# Patient Record
Sex: Female | Born: 1970 | Race: White | Hispanic: No | Marital: Married | Smoking: Never smoker
Health system: Southern US, Community
[De-identification: ages and names within clinical notes are randomized; demographics above are authoritative.]

---

## 2011-02-14 ENCOUNTER — Ambulatory Visit: Payer: Self-pay | Admitting: Obstetrics and Gynecology

## 2012-05-25 ENCOUNTER — Ambulatory Visit: Payer: Self-pay | Admitting: Family Medicine

## 2014-08-22 ENCOUNTER — Ambulatory Visit: Payer: Self-pay | Admitting: Family Medicine

## 2014-08-23 ENCOUNTER — Ambulatory Visit: Payer: Self-pay | Admitting: Family Medicine

## 2015-05-09 ENCOUNTER — Other Ambulatory Visit: Payer: Self-pay | Admitting: Family Medicine

## 2015-05-09 DIAGNOSIS — Z1231 Encounter for screening mammogram for malignant neoplasm of breast: Secondary | ICD-10-CM

## 2015-08-24 ENCOUNTER — Ambulatory Visit
Admission: RE | Admit: 2015-08-24 | Discharge: 2015-08-24 | Disposition: A | Discharge status: Home / Self Care | Payer: BLUE CROSS/BLUE SHIELD | Source: Ambulatory Visit | Attending: Family Medicine | Admitting: Family Medicine

## 2015-08-24 DIAGNOSIS — Z1231 Encounter for screening mammogram for malignant neoplasm of breast: Secondary | ICD-10-CM | POA: Insufficient documentation

## 2015-09-24 ENCOUNTER — Emergency Department: Payer: BLUE CROSS/BLUE SHIELD

## 2015-09-24 ENCOUNTER — Emergency Department
Admission: EM | Admit: 2015-09-24 | Discharge: 2015-09-24 | Disposition: A | Discharge status: Home / Self Care | Payer: BLUE CROSS/BLUE SHIELD | Attending: Emergency Medicine | Admitting: Emergency Medicine

## 2015-09-24 DIAGNOSIS — M546 Pain in thoracic spine: Secondary | ICD-10-CM

## 2015-09-24 DIAGNOSIS — G8929 Other chronic pain: Secondary | ICD-10-CM | POA: Insufficient documentation

## 2015-09-24 DIAGNOSIS — R091 Pleurisy: Secondary | ICD-10-CM | POA: Diagnosis not present

## 2015-09-24 LAB — BASIC METABOLIC PANEL
Anion gap: 5 (ref 5–15)
BUN: 12 mg/dL (ref 6–20)
CALCIUM: 8.9 mg/dL (ref 8.9–10.3)
CO2: 27 mmol/L (ref 22–32)
CREATININE: 0.64 mg/dL (ref 0.44–1.00)
Chloride: 107 mmol/L (ref 101–111)
Glucose, Bld: 85 mg/dL (ref 65–99)
Potassium: 3.8 mmol/L (ref 3.5–5.1)
Sodium: 139 mmol/L (ref 135–145)

## 2015-09-24 LAB — FIBRIN DERIVATIVES D-DIMER (ARMC ONLY): Fibrin derivatives D-dimer (ARMC): 236 (ref 0–499)

## 2015-09-24 MED ORDER — DIAZEPAM 5 MG PO TABS: 5.0000 mg | ORAL_TABLET | Freq: Three times a day (TID) | ORAL | Status: AC | PRN

## 2015-09-24 MED ORDER — KETOROLAC TROMETHAMINE 30 MG/ML IJ SOLN
30.0000 mg | Freq: Once | INTRAMUSCULAR | Status: AC
  Administered 2015-09-24: 30 mg via INTRAMUSCULAR
  Filled 2015-09-24: qty 1

## 2015-09-24 MED ORDER — NAPROXEN 500 MG PO TABS: 500.0000 mg | ORAL_TABLET | Freq: Two times a day (BID) | ORAL | Status: DC

## 2015-09-24 NOTE — Discharge Instructions (Signed)

## 2015-09-24 NOTE — ED Provider Notes (Signed)
[facility] Emergency Department Provider Note  ____________________________________________  Time seen: On arrival  I have reviewed the triage vital signs and the nursing notes.   HISTORY  Chief Complaint Back Pain    HPI Laura Cummings is a 44 y.o. female who presents with mild to moderate left upper back pain. She reports it started 2 days ago and became worse this morning to the point where if she takes a deep breath it hurts. She reports she has chronic lower back pain but this pain is no different location. She denies injury to the area. No cough no fevers. No shortness of breath. No tachycardia. No calf pain. No recent travel. No history of DVTs. No hormone use. No chest pain     History reviewed. No pertinent past medical history.  There are no active problems to display for this patient.   History reviewed. No pertinent past surgical history.  No current outpatient prescriptions on file.  Allergies Review of patient's allergies indicates no known allergies.  History reviewed. No pertinent family history.  Social History Social History  Substance Use Topics  . Smoking status: Never Smoker   . Smokeless tobacco: None  . Alcohol Use: No    Review of Systems  Constitutional: Negative for fever. Eyes: Negative for visual changes. ENT: Negative for sore throat Cardiovascular: Negative for chest pain. Respiratory: Negative for shortness of breath. Positive for pleurisy Gastrointestinal: Negative for abdominal pain, vomiting and diarrhea. Genitourinary: Negative for dysuria. Musculoskeletal: As above for back pain. Skin: Negative for rash. Neurological: Negative for headaches oPsychiatric: No anxiety    ____________________________________________   PHYSICAL EXAM:  VITAL SIGNS: ED Triage Vitals  Enc Vitals Group     BP 09/24/15 0740 110/63 mmHg     Pulse Rate 09/24/15 0740 84     Resp 09/24/15 0740 16     Temp  09/24/15 0740 98 F (36.7 C)     Temp Source 09/24/15 0740 Oral     SpO2 09/24/15 0740 100 %     Weight 09/24/15 0740 180 lb (81.647 kg)     Height 09/24/15 0740  (1.626 m)     Head Cir --      Peak Flow --      Pain Score 09/24/15 0738 8     Pain Loc --      Pain Edu? --      Excl. in GC? --      Constitutional: Alert and oriented. Well appearing and in no distress. Eyes: Conjunctivae are normal.  ENT   Head: Normocephalic and atraumatic.   Mouth/Throat: Mucous membranes are moist. Cardiovascular: Normal rate, regular rhythm. Normal and symmetric distal pulses are present in all extremities. No murmurs, rubs, or gallops. Respiratory: Normal respiratory effort without tachypnea nor retractions. Breath sounds are clear and equal bilaterally.  Gastrointestinal: Soft and non-tender in all quadrants. No distention. There is no CVA tenderness. Genitourinary: deferred Musculoskeletal: Nontender with normal range of motion in all extremities. No lower extremity tenderness nor edema. Patient with mild discomfort with palpation just inferior to the left scapula, she has pain in this area if she moves primarily with flexion or rotation of the torso. No rash or bony abnormalities. Neurologic:  Normal speech and language. No gross focal neurologic deficits are appreciated. Skin:  Skin is warm, dry and intact. No rash noted. Psychiatric: Mood and affect are normal. Patient exhibits appropriate insight and judgment.  ____________________________________________    LABS (pertinent positives/negatives)  Labs  Reviewed - No data to display  ____________________________________________   EKG  None  ____________________________________________    RADIOLOGY I have personally reviewed any xrays that were ordered on this patient: Chest x-ray unremarkable  ____________________________________________   PROCEDURES  Procedure(s) performed: none  Critical Care performed:  none  ____________________________________________   INITIAL IMPRESSION / ASSESSMENT AND PLAN / ED COURSE  Pertinent labs & imaging results that were available during my care of the patient were reviewed by me and considered in my medical decision making (see chart for details).  Talbert ForestShirley with patient's history of present illness this seems muscular skeletal in nature but given her pleurisy I recommended d-dimer/labs with analgesics. The patient feels strongly this is similar in nature and would prefer not to do labs. Given that she is PERC negative I think this is reasonable especially because she is stated that if she becomes worse she will return to the emergency department. Hence the plan is IM Toradol and chest x-ray and reevaluation  Patient changed her mind and did request lab work which we will do.  D-dimer unremarkable. Patient is appropriate for discharge with outpatient follow-up  ____________________________________________   FINAL CLINICAL IMPRESSION(S) / ED DIAGNOSES  Final diagnoses:  Left-sided thoracic back pain     Jene Everyobert Delsin Copen, MD 09/24/15 1455

## 2015-09-24 NOTE — ED Notes (Signed)
Patient comes in with complaints of mid back pain that radiates to left flank and hurts with inspiration.  Patient reports that pain started Saturday. Last dose of ibuprofen 600mg  at 06:15 today with no pain relief.

## 2015-09-24 NOTE — ED Notes (Signed)
Pt discharged home after verbalizing understanding of discharge instructions; nad noted. 

## 2015-09-24 NOTE — ED Notes (Signed)
Pt from home with back pain. States she has had lower back pain for years in the early morning. She states that pain is constant, between shoulder blades, and she has intermittent sharp pains that keep her from getting comfortable. Pt is alert & oriented, and restless & fidgety.

## 2015-12-12 ENCOUNTER — Ambulatory Visit: Payer: Self-pay | Admitting: Physician Assistant

## 2015-12-12 ENCOUNTER — Encounter: Payer: Self-pay | Admitting: Physician Assistant

## 2015-12-12 VITALS — BP 120/70 | HR 80 | Temp 97.8°F

## 2015-12-12 DIAGNOSIS — J029 Acute pharyngitis, unspecified: Secondary | ICD-10-CM

## 2015-12-12 DIAGNOSIS — R6883 Chills (without fever): Secondary | ICD-10-CM

## 2015-12-12 LAB — POCT URINALYSIS DIPSTICK
Bilirubin, UA: NEGATIVE
GLUCOSE UA: NEGATIVE
Ketones, UA: NEGATIVE
Leukocytes, UA: NEGATIVE
Nitrite, UA: NEGATIVE
PH UA: 6
Protein, UA: NEGATIVE
UROBILINOGEN UA: 0.2

## 2015-12-12 LAB — POCT INFLUENZA A/B
INFLUENZA A, POC: NEGATIVE
INFLUENZA B, POC: NEGATIVE

## 2015-12-12 MED ORDER — AZITHROMYCIN 250 MG PO TABS
ORAL_TABLET | ORAL | Status: DC
Start: 2015-12-12 — End: 2016-11-28

## 2015-12-12 NOTE — Progress Notes (Signed)
S: having chills, scratchy throat, no cough or congestion , some ear pain last night, no uti sx, no v/d  O: vitals wnl, nad, appears well, tms clear, nasal mucosa wnl, throat mildly injected; neck supple no lymph, lungs c t a, cv rrr, flu swab neg, ua neg  A: post nasal drip, pharyngitis  P: zpack

## 2016-08-01 ENCOUNTER — Other Ambulatory Visit: Payer: Self-pay | Admitting: Family Medicine

## 2016-08-01 DIAGNOSIS — Z1231 Encounter for screening mammogram for malignant neoplasm of breast: Secondary | ICD-10-CM

## 2016-09-02 ENCOUNTER — Ambulatory Visit
Admission: RE | Admit: 2016-09-02 | Discharge: 2016-09-02 | Disposition: A | Discharge status: Home / Self Care | Payer: BLUE CROSS/BLUE SHIELD | Source: Ambulatory Visit | Attending: Family Medicine | Admitting: Family Medicine

## 2016-09-02 DIAGNOSIS — Z1231 Encounter for screening mammogram for malignant neoplasm of breast: Secondary | ICD-10-CM | POA: Diagnosis not present

## 2016-11-13 ENCOUNTER — Encounter: Payer: Self-pay | Admitting: Physician Assistant

## 2016-11-13 ENCOUNTER — Ambulatory Visit: Payer: Self-pay | Admitting: Physician Assistant

## 2016-11-13 VITALS — BP 98/70 | HR 86 | Temp 97.8°F

## 2016-11-13 DIAGNOSIS — J0120 Acute ethmoidal sinusitis, unspecified: Secondary | ICD-10-CM

## 2016-11-13 MED ORDER — AMOXICILLIN 500 MG PO CAPS: 500.0000 mg | ORAL_CAPSULE | Freq: Three times a day (TID) | ORAL | 0 refills | Status: DC

## 2016-11-13 MED ORDER — FEXOFENADINE-PSEUDOEPHED ER 60-120 MG PO TB12: 1.0000 | ORAL_TABLET | Freq: Two times a day (BID) | ORAL | 0 refills | Status: AC

## 2016-11-13 NOTE — Progress Notes (Signed)
   Subjective:sinus and ear pain    Patient ID: Laura Cummings, female    DOB: 1971/10/14, 46 y.o.   MRN: [id number]  HPI Patient c/o sinus pressure, ear pain, and sore throat for one week. Denies fever/chill, or N/V/D. States this is a regular seasonal onset. No palliative measures for compliant.   Review of Systems Negative except for compliant.    Objective:   Physical Exam HEENT for bilateral maxillary guarding, edematous nasal turbinates with thick rhinorrhea. Copious post nasal drainage. Bilateral edematous TM. Neck supple, Lungs CTA, and Heart RRR.       Assessment & Plan:Sinusitis  Amoxil and Allergra-D as directed. Follow up one week if no improvement.

## 2016-11-28 ENCOUNTER — Ambulatory Visit: Payer: Self-pay | Admitting: Physician Assistant

## 2016-11-28 ENCOUNTER — Encounter: Payer: Self-pay | Admitting: Physician Assistant

## 2016-11-28 VITALS — BP 100/70 | HR 92 | Temp 98.6°F

## 2016-11-28 DIAGNOSIS — J209 Acute bronchitis, unspecified: Secondary | ICD-10-CM

## 2016-11-28 MED ORDER — AZITHROMYCIN 250 MG PO TABS: ORAL_TABLET | ORAL | 0 refills | Status: DC

## 2016-11-28 MED ORDER — BENZONATATE 200 MG PO CAPS: 200.0000 mg | ORAL_CAPSULE | Freq: Three times a day (TID) | ORAL | 0 refills | Status: DC | PRN

## 2016-11-28 NOTE — Progress Notes (Signed)
S: C/o runny nose and congestion for 2 weeks, cough for 7 days, no fever, chills, cp/sob, v/d; mucus was green this am but clear throughout the day, cough is sporadic, was on amoxil over 2 weeks ago but quit taking the medicine when she felt better and let her husband take the rest  Using otc meds:   O: PE: vitals wnl, nad, perrl eomi, normocephalic, tms dull, nasal mucosa red and swollen, throat injected, neck supple no lymph, lungs c t a, cv rrr, neuro intact  A:  Acute bronchitis   P: drink fluids, continue regular meds , use otc meds of choice, return if not improving in 5 days, return earlier if worsening , zpack, tessalon perls

## 2016-12-01 ENCOUNTER — Ambulatory Visit: Payer: Self-pay | Admitting: Physician Assistant

## 2016-12-01 ENCOUNTER — Encounter: Payer: Self-pay | Admitting: Physician Assistant

## 2016-12-01 VITALS — BP 110/62 | HR 106 | Temp 99.6°F

## 2016-12-01 DIAGNOSIS — A084 Viral intestinal infection, unspecified: Secondary | ICD-10-CM

## 2016-12-01 MED ORDER — PROMETHAZINE HCL 25 MG PO TABS: 25.0000 mg | ORAL_TABLET | Freq: Three times a day (TID) | ORAL | 0 refills | Status: DC | PRN

## 2016-12-01 NOTE — Patient Instructions (Addendum)
Viral Illness, Adult Viruses are tiny germs that can get into a person's body and cause illness. There are many different types of viruses, and they cause many types of illness. Viral illnesses can range from mild to severe. They can affect various parts of the body. Common illnesses that are caused by a virus include colds and the flu. Viral illnesses also include serious conditions such as HIV/AIDS (human immunodeficiency virus/acquired immunodeficiency syndrome). A few viruses have been linked to certain cancers. What are the causes? Many types of viruses can cause illness. Viruses invade cells in your body, multiply, and cause the infected cells to malfunction or die. When the cell dies, it releases more of the virus. When this happens, you develop symptoms of the illness, and the virus continues to spread to other cells. If the virus takes over the function of the cell, it can cause the cell to divide and grow out of control, as is the case when a virus causes cancer. Different viruses get into the body in different ways. You can get a virus by:  Swallowing food or water that is contaminated with the virus.  Breathing in droplets that have been coughed or sneezed into the air by an infected person.  Touching a surface that has been contaminated with the virus and then touching your eyes, nose, or mouth.  Being bitten by an insect or animal that carries the virus.  Having sexual contact with a person who is infected with the virus.  Being exposed to blood or fluids that contain the virus, either through an open cut or during a transfusion. If a virus enters your body, your body's defense system (immune system) will try to fight the virus. You may be at higher risk for a viral illness if your immune system is weak. What are the signs or symptoms? Symptoms vary depending on the type of virus and the location of the cells that it invades. Common symptoms of the main types of viral illnesses  include: Cold and flu viruses   Fever.  Headache.  Sore throat.  Muscle aches.  Nasal congestion.  Cough. Digestive system (gastrointestinal) viruses   Fever.  Abdominal pain.  Nausea.  Diarrhea. Liver viruses (hepatitis)   Loss of appetite.  Tiredness.  Yellowing of the skin (jaundice). Brain and spinal cord viruses   Fever.  Headache.  Stiff neck.  Nausea and vomiting.  Confusion or sleepiness. Skin viruses   Warts.  Itching.  Rash. Sexually transmitted viruses   Discharge.  Swelling.  Redness.  Rash. How is this treated? Viruses can be difficult to treat because they live within cells. Antibiotic medicines do not treat viruses because these drugs do not get inside cells. Treatment for a viral illness may include:  Resting and drinking plenty of fluids.  Medicines to relieve symptoms. These can include over-the-counter medicine for pain and fever, medicines for cough or congestion, and medicines to relieve diarrhea.  Antiviral medicines. These drugs are available only for certain types of viruses. They may help reduce flu symptoms if taken early. There are also many antiviral medicines for hepatitis and HIV/AIDS. Some viral illnesses can be prevented with vaccinations. A common example is the flu shot. Follow these instructions at home: Medicines    Take over-the-counter and prescription medicines only as told by your health care provider.  If you were prescribed an antiviral medicine, take it as told by your health care provider. Do not stop taking the medicine even if you start to   are needed and when they are not needed. Antibiotics do not treat viruses. If your health care provider thinks that you may have a bacterial infection as well as a viral infection, you may get an antibiotic.  Do not ask for an antibiotic prescription if you have been diagnosed with a viral illness. That will not make your  illness go away faster.  Frequently taking antibiotics when they are not needed can lead to antibiotic resistance. When this develops, the medicine no longer works against the bacteria that it normally fights. General instructions  Drink enough fluids to keep your urine clear or pale yellow.  Rest as much as possible.  Return to your normal activities as told by your health care provider. Ask your health care provider what activities are safe for you.  Keep all follow-up visits as told by your health care provider. This is important. How is this prevented? Take these actions to reduce your risk of viral infection:  Eat a healthy diet and get enough rest.  Wash your hands often with soap and water. This is especially important when you are in public places. If soap and water are not available, use hand sanitizer.  Avoid close contact with friends and family who have a viral illness.  If you travel to areas where viral gastrointestinal infection is common, avoid drinking water or eating raw food.  Keep your immunizations up to date. Get a flu shot every year as told by your health care provider.  Do not share toothbrushes, nail clippers, razors, or needles with other people.  Always practice safe sex. Contact a health care provider if:  You have symptoms of a viral illness that do not go away.  Your symptoms come back after going away.  Your symptoms get worse. Get help right away if:  You have trouble breathing.  You have a severe headache or a stiff neck.  You have severe vomiting or abdominal pain. This information is not intended to replace advice given to you by your health care provider. Make sure you discuss any questions you have with your health care provider. Document Released: 02/15/2016 Document Revised: 03/19/2016 Document Reviewed: 02/15/2016 Elsevier Interactive Patient Education  2017 [employer]  Viral Gastroenteritis, Adult Introduction Viral  gastroenteritis is also known as the stomach flu. This condition is caused by certain germs (viruses). These germs can be passed from person to person very easily (are very contagious). This condition can cause sudden watery poop (diarrhea), fever, and throwing up (vomiting). Having watery poop and throwing up can make you feel weak and cause you to get dehydrated. Dehydration can make you tired and thirsty, make you have a dry mouth, and make it so you pee (urinate) less often. Older adults and people with other diseases or a weak defense system (immune system) are at higher risk for dehydration. It is important to replace the fluids that you lose from having watery poop and throwing up. Follow these instructions at home: Follow instructions from your doctor about how to care for yourself at home. Eating and drinking Follow these instructions as told by your doctor:  Take an oral rehydration solution (ORS). This is a drink that is sold at pharmacies and stores.  Drink clear fluids in small amounts as you are able, such as:  Water.  Ice chips.  Diluted fruit juice.  Low-calorie sports drinks.  Eat bland, easy-to-digest foods in small amounts as you are able, such as:  Bananas.  Applesauce.  Rice.  Low-fat (lean) meats.  Toast.  Crackers.  Avoid fluids that have a lot of sugar or caffeine in them.  Avoid alcohol.  Avoid spicy or fatty foods. General instructions  Drink enough fluid to keep your pee (urine) clear or pale yellow.  Wash your hands often. If you cannot use soap and water, use hand sanitizer.  Make sure that all people in your home wash their hands well and often.  Rest at home while you get better.  Take over-the-counter and prescription medicines only as told by your doctor.  Watch your condition for any changes.  Take a warm bath to help with any burning or pain from having watery poop.  Keep all follow-up visits as told by your doctor. This is  important. Contact a doctor if:  You cannot keep fluids down.  Your symptoms get worse.  You have new symptoms.  You feel light-headed or dizzy.  You have muscle cramps. Get help right away if:  You have chest pain.  You feel very weak or you pass out (faint).  You see blood in your throw-up.  Your throw-up looks like coffee grounds.  You have bloody or black poop (stools) or poop that look like tar.  You have a very bad headache, a stiff neck, or both.  You have a rash.  You have very bad pain, cramping, or bloating in your belly (abdomen).  You have trouble breathing.  You are breathing very quickly.  Your heart is beating very quickly.  Your skin feels cold and clammy.  You feel confused.  You have pain when you pee.  You have signs of dehydration, such as:  Dark pee, hardly any pee, or no pee.  Cracked lips.  Dry mouth.  Sunken eyes.  Sleepiness.  Weakness. This information is not intended to replace advice given to you by your health care provider. Make sure you discuss any questions you have with your health care provider. Document Released: 03/24/2008 Document Revised: 04/25/2016 Document Reviewed: 06/12/2015  2017 Elsevier

## 2016-12-01 NOTE — Progress Notes (Signed)
S:  Pt c/o vomiting and diarrhea, sx for 1 day, + fever/chills, no abd pain except for cramping with diarrhea; denies cp/sob, denies camping, bad food, or exposure to bad water; states her coworker has the same sx, stopped the zpack yesterday after the v/d started Remainder ros neg  O:  Vitals wnl, nad, ENT wnl, neck supple no lymph, lungs c t a, cv rrr, abd soft nontender bs increased lower quads b/l, neuro intact  A:  Viral gastroenteritis  P:  Reassurance, fluids, brat diet, immodium ad for diarrhea if needed, rx phenergan 25mg  tid prn vomiting, return if not better in 3 days, return earlier if worsening, stop zpack, if worsening return to clinic or go to ER

## 2017-07-14 ENCOUNTER — Other Ambulatory Visit: Payer: Self-pay | Admitting: Family Medicine

## 2017-07-14 DIAGNOSIS — Z1231 Encounter for screening mammogram for malignant neoplasm of breast: Secondary | ICD-10-CM

## 2017-09-04 ENCOUNTER — Ambulatory Visit
Admission: RE | Admit: 2017-09-04 | Discharge: 2017-09-04 | Disposition: A | Discharge status: Home / Self Care | Payer: BLUE CROSS/BLUE SHIELD | Source: Ambulatory Visit | Attending: Family Medicine | Admitting: Family Medicine

## 2017-09-04 DIAGNOSIS — Z1231 Encounter for screening mammogram for malignant neoplasm of breast: Secondary | ICD-10-CM | POA: Diagnosis present

## 2018-07-26 ENCOUNTER — Other Ambulatory Visit: Payer: Self-pay | Admitting: Family Medicine

## 2018-07-26 DIAGNOSIS — Z1231 Encounter for screening mammogram for malignant neoplasm of breast: Secondary | ICD-10-CM

## 2018-09-06 ENCOUNTER — Ambulatory Visit
Admission: RE | Admit: 2018-09-06 | Discharge: 2018-09-06 | Disposition: A | Discharge status: Home / Self Care | Payer: BLUE CROSS/BLUE SHIELD | Source: Ambulatory Visit | Attending: Family Medicine | Admitting: Family Medicine

## 2018-09-06 DIAGNOSIS — Z1231 Encounter for screening mammogram for malignant neoplasm of breast: Secondary | ICD-10-CM | POA: Insufficient documentation

## 2019-05-12 ENCOUNTER — Other Ambulatory Visit: Payer: Self-pay | Admitting: Family Medicine

## 2019-05-12 DIAGNOSIS — Z1231 Encounter for screening mammogram for malignant neoplasm of breast: Secondary | ICD-10-CM

## 2019-09-08 ENCOUNTER — Ambulatory Visit
Admission: RE | Admit: 2019-09-08 | Discharge: 2019-09-08 | Disposition: A | Discharge status: Home / Self Care | Payer: BC Managed Care – PPO | Source: Ambulatory Visit | Attending: Family Medicine | Admitting: Family Medicine

## 2019-09-08 DIAGNOSIS — Z1231 Encounter for screening mammogram for malignant neoplasm of breast: Secondary | ICD-10-CM | POA: Insufficient documentation

## 2020-08-06 ENCOUNTER — Other Ambulatory Visit: Payer: Self-pay | Admitting: Family Medicine

## 2020-08-06 ENCOUNTER — Other Ambulatory Visit: Payer: Self-pay | Admitting: Gerontology

## 2020-08-06 DIAGNOSIS — Z1231 Encounter for screening mammogram for malignant neoplasm of breast: Secondary | ICD-10-CM

## 2020-09-25 ENCOUNTER — Other Ambulatory Visit: Payer: Self-pay

## 2020-09-25 ENCOUNTER — Ambulatory Visit
Admission: RE | Admit: 2020-09-25 | Discharge: 2020-09-25 | Disposition: A | Discharge status: Home / Self Care | Payer: BC Managed Care – PPO | Source: Ambulatory Visit | Attending: Gerontology | Admitting: Gerontology

## 2020-09-25 DIAGNOSIS — Z1231 Encounter for screening mammogram for malignant neoplasm of breast: Secondary | ICD-10-CM

## 2021-08-20 ENCOUNTER — Encounter: Payer: Self-pay | Admitting: Emergency Medicine

## 2021-08-20 ENCOUNTER — Emergency Department
Admission: EM | Admit: 2021-08-20 | Discharge: 2021-08-21 | Disposition: A | Discharge status: Home / Self Care | Payer: PRIVATE HEALTH INSURANCE | Attending: Student in an Organized Health Care Education/Training Program | Admitting: Student in an Organized Health Care Education/Training Program

## 2021-08-20 ENCOUNTER — Emergency Department: Payer: PRIVATE HEALTH INSURANCE

## 2021-08-20 DIAGNOSIS — W108XXA Fall (on) (from) other stairs and steps, initial encounter: Secondary | ICD-10-CM | POA: Insufficient documentation

## 2021-08-20 DIAGNOSIS — S80212A Abrasion, left knee, initial encounter: Secondary | ICD-10-CM | POA: Insufficient documentation

## 2021-08-20 DIAGNOSIS — S63259A Unspecified dislocation of unspecified finger, initial encounter: Secondary | ICD-10-CM | POA: Diagnosis not present

## 2021-08-20 DIAGNOSIS — S63250A Unspecified dislocation of right index finger, initial encounter: Secondary | ICD-10-CM | POA: Diagnosis not present

## 2021-08-20 DIAGNOSIS — Z23 Encounter for immunization: Secondary | ICD-10-CM | POA: Diagnosis not present

## 2021-08-20 DIAGNOSIS — S81809A Unspecified open wound, unspecified lower leg, initial encounter: Secondary | ICD-10-CM | POA: Diagnosis not present

## 2021-08-20 DIAGNOSIS — T07XXXA Unspecified multiple injuries, initial encounter: Secondary | ICD-10-CM

## 2021-08-20 DIAGNOSIS — S6991XA Unspecified injury of right wrist, hand and finger(s), initial encounter: Secondary | ICD-10-CM | POA: Diagnosis present

## 2021-08-20 DIAGNOSIS — S60511A Abrasion of right hand, initial encounter: Secondary | ICD-10-CM | POA: Diagnosis not present

## 2021-08-20 DIAGNOSIS — Y9301 Activity, walking, marching and hiking: Secondary | ICD-10-CM | POA: Insufficient documentation

## 2021-08-20 MED ORDER — TETANUS-DIPHTH-ACELL PERTUSSIS 5-2.5-18.5 LF-MCG/0.5 IM SUSY
0.5000 mL | PREFILLED_SYRINGE | Freq: Once | INTRAMUSCULAR | Status: AC
  Administered 2021-08-21: 0.5 mL via INTRAMUSCULAR
  Filled 2021-08-20: qty 0.5

## 2021-08-20 MED ORDER — LIDOCAINE HCL (PF) 1 % IJ SOLN
10.0000 mL | Freq: Once | INTRAMUSCULAR | Status: AC
  Administered 2021-08-20: 10 mL
  Filled 2021-08-20: qty 10

## 2021-08-20 MED ORDER — FENTANYL CITRATE (PF) 250 MCG/5ML IJ SOLN
100.0000 ug | Freq: Once | INTRAMUSCULAR | Status: AC
  Administered 2021-08-20: 100 ug via INTRAMUSCULAR
  Filled 2021-08-20: qty 5

## 2021-08-20 MED ORDER — CEPHALEXIN 500 MG PO CAPS: 1000.0000 mg | ORAL_CAPSULE | Freq: Two times a day (BID) | ORAL | 0 refills | Status: DC

## 2021-08-20 MED ORDER — MELOXICAM 15 MG PO TABS: 15.0000 mg | ORAL_TABLET | Freq: Every day | ORAL | 0 refills | Status: DC

## 2021-08-20 MED ORDER — HYDROCODONE-ACETAMINOPHEN 5-325 MG PO TABS: 1.0000 | ORAL_TABLET | ORAL | 0 refills | Status: AC | PRN

## 2021-08-20 MED ORDER — ONDANSETRON 8 MG PO TBDP
8.0000 mg | ORAL_TABLET | Freq: Once | ORAL | Status: AC
  Administered 2021-08-20: 8 mg via ORAL
  Filled 2021-08-20: qty 1

## 2021-08-20 NOTE — ED Triage Notes (Signed)
Pt fell while coming into work [employer] and caught herself with right hand. Pt has obvious deformity noted to the right 2nd digit. Pt denies pain at site. Pt with abrasions noted to the right knee with minimal bleeding.

## 2021-08-20 NOTE — ED Provider Notes (Signed)
[facility] Emergency Department Provider Note  ____________________________________________  Time seen: Approximately 11:22 PM  I have reviewed the triage vital signs and the nursing notes.   HISTORY  Chief Complaint Finger Injury    HPI Laura Cummings is a 50 y.o. female who presents the emergency department with an injury to her right index finger.  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  Patient had obvious deformity to the index finger at time of injury.  She states that the pain was minimal at first but as she walked the rest of the way into the emergency department holding onto her digit the pain be started to develop.  Patient has obvious deformity to the digit with few scattered abrasions to the right hand from trying to catch her self.  There is no significant wound to the right hand.  Patient denies any other injury or complaint.  She did not hit her head or lose consciousness.  No other musculoskeletal complaints.  Unsure of last tetanus shot.       History reviewed. No pertinent past medical history.  There are no problems to display for this patient.   History reviewed. No pertinent surgical history.  Prior to Admission medications   Medication Sig Start Date End Date Taking? Authorizing Provider  cephALEXin (KEFLEX) 500 MG capsule Take 2 capsules (1,000 mg total) by mouth 2 (two) times daily. 08/20/21  Yes Kachina Niederer, Delorise Royals, PA-C  HYDROcodone-acetaminophen (NORCO/VICODIN) 5-325 MG tablet Take 1 tablet by mouth every 4 (four) hours as needed for moderate pain. 08/20/21 08/20/22 Yes Amari Burnsworth, Delorise Royals, PA-C  meloxicam (MOBIC) 15 MG tablet Take 1 tablet (15 mg total) by mouth daily. 08/20/21  Yes Nabor Thomann, Delorise Royals, PA-C  azithromycin (ZITHROMAX Z-PAK) 250 MG tablet 2 pills today then 1 pill a day for 4 days 11/28/16   Sherrie Mustache Roselyn Bering, PA-C   benzonatate (TESSALON) 200 MG capsule Take 1 capsule (200 mg total) by mouth 3 (three) times daily as needed for cough. 11/28/16   Fisher, Roselyn Bering, PA-C  fexofenadine-pseudoephedrine (ALLEGRA-D) 60-120 MG 12 hr tablet Take 1 tablet by mouth 2 (two) times daily. 11/13/16   Joni Reining, PA-C  ibuprofen (ADVIL,MOTRIN) 200 MG tablet Take 600 mg by mouth every 6 (six) hours as needed for moderate pain.    [provider]  naproxen (NAPROSYN) 500 MG tablet Take 1 tablet (500 mg total) by mouth 2 (two) times daily with a meal. 09/24/15   Jene Every, MD  promethazine (PHENERGAN) 25 MG tablet Take 1 tablet (25 mg total) by mouth every 8 (eight) hours as needed for nausea or vomiting. 12/01/16   Sherrie Mustache Roselyn Bering, PA-C    Allergies Patient has no known allergies.  Family History  Problem Relation Age of Onset   Breast cancer Neg Hx     Social History Social History   Tobacco Use   Smoking status: Never   Smokeless tobacco: Never  Substance Use Topics   Alcohol use: No     Review of Systems  Constitutional: No fever/chills Eyes: No visual changes. No discharge ENT: No upper respiratory complaints. Cardiovascular: no chest pain. Respiratory: no cough. No SOB. Gastrointestinal: No abdominal pain.  No nausea, no vomiting.  No diarrhea.  No constipation. Musculoskeletal: Positive for deformity/injury to the index finger Skin: Negative for rash, abrasions, lacerations, ecchymosis. Neurological: Negative for headaches, focal  weakness or numbness.  10 System ROS otherwise negative.  ____________________________________________   PHYSICAL EXAM:  VITAL SIGNS: ED Triage Vitals [08/20/21 2309]  Enc Vitals Group     BP 134/78     Pulse Rate (!) 112     Resp (!) 21     Temp 98 F (36.7 C)     Temp Source Oral     SpO2 99 %     Weight      Height      Head Circumference      Peak Flow      Pain Score      Pain Loc      Pain Edu?      Excl. in GC?       Constitutional: Alert and oriented. Well appearing and in no acute distress. Eyes: Conjunctivae are normal. PERRL. EOMI. Head: Atraumatic. ENT:      Ears:       Nose: No congestion/rhinnorhea.      Mouth/Throat: Mucous membranes are moist.  Neck: No stridor.    Cardiovascular: Normal rate, regular rhythm. Normal S1 and S2.  Good peripheral circulation. Respiratory: Normal respiratory effort without tachypnea or retractions. Lungs CTAB. Good air entry to the bases with no decreased or absent breath sounds. Musculoskeletal: Full range of motion to all extremities. No gross deformities appreciated.  Obvious deformity to the PIP joint of the index finger right hand.  Patient's index finger is angulated medially and anteriorly.  Few scattered abrasions to the right hand.  Abrasion to the left knee.  No visible foreign body.  No active bleeding from any abrasion. Neurologic:  Normal speech and language. No gross focal neurologic deficits are appreciated.  Skin:  Skin is warm, dry and intact. No rash noted. Psychiatric: Mood and affect are normal. Speech and behavior are normal. Patient exhibits appropriate insight and judgement.   ____________________________________________   LABS (all labs ordered are listed, but only abnormal results are displayed)  Labs Reviewed - No data to display ____________________________________________  EKG   ____________________________________________  RADIOLOGY I personally viewed and evaluated these images as part of my medical decision making, as well as reviewing the written report by the radiologist.  ED Provider Interpretation: Dislocation of the PIP joint of the right hand index finger  DG Hand Complete Right  Result Date: 08/20/2021 CLINICAL DATA:  Right index finger injury EXAM: RIGHT HAND - COMPLETE 3+ VIEW COMPARISON:  None. FINDINGS: Three view radiograph right index finger demonstrates superolateral dislocation of the second PIP joint  with superolateral angulation, override and mild internal rotation of the distal segment. No definite associated fracture. Remaining joint spaces are preserved. Soft tissues are unremarkable. IMPRESSION: Right second PIP joint superolateral dislocation Electronically Signed   By: Helyn Numbers M.D.   On: 08/20/2021 23:19    ____________________________________________    PROCEDURES  Procedure(s) performed:    Procedures    Medications  lidocaine (PF) (XYLOCAINE) 1 % injection 10 mL (has no administration in time range)  Tdap (BOOSTRIX) injection 0.5 mL (has no administration in time range)  fentaNYL citrate (PF) (SUBLIMAZE) injection 100 mcg (100 mcg Intramuscular Given 08/20/21 2306)  ondansetron (ZOFRAN-ODT) disintegrating tablet 8 mg (8 mg Oral Given by Other 08/20/21 2259)     ____________________________________________   INITIAL IMPRESSION / ASSESSMENT AND PLAN / ED COURSE  Pertinent labs & imaging results that were available during my care of the patient were reviewed by me and considered in my medical decision making (see chart  for details).  Review of the [place] CSRS was performed in accordance of the [facility] prior to dispensing any controlled drugs.           Patient's diagnosis is consistent with dislocated finger.  Patient presents emergency department after slipping and falling while she was walking into work.  Patient has an obvious deformity to the index finger.  Several abrasions were noted, 1 to the left knee, also to the right hand from trying to catch her self.  Patient's tetanus shot is updated.  Index finger is reduced as described above.  Patient has full range of motion after reduction.  Good visible, palpable reduction and imaging is not deemed necessary at this time for follow-up..  Patient will have prescription for antibiotics prophylactically, anti-inflammatory and very limited pain medication.  Follow-up with orthopedic surgery.  Patient is given ED  precautions to return to the ED for any worsening or new symptoms.     ____________________________________________  FINAL CLINICAL IMPRESSION(S) / ED DIAGNOSES  Final diagnoses:  Dislocation of finger, initial encounter  Multiple abrasions      NEW MEDICATIONS STARTED DURING THIS VISIT:  ED Discharge Orders          Ordered    meloxicam (MOBIC) 15 MG tablet  Daily        08/20/21 2354    HYDROcodone-acetaminophen (NORCO/VICODIN) 5-325 MG tablet  Every 4 hours PRN        08/20/21 2354    cephALEXin (KEFLEX) 500 MG capsule  2 times daily        08/20/21 2354                This chart was dictated using voice recognition software/Dragon. Despite best efforts to proofread, errors can occur which can change the meaning. Any change was purely unintentional.    Racheal Patches, PA-C 08/20/21 2355    Willy Eddy, MD 08/23/21 1445

## 2021-08-21 MED ORDER — HYDROCODONE-ACETAMINOPHEN 5-325 MG PO TABS
1.0000 | ORAL_TABLET | Freq: Once | ORAL | Status: AC
  Administered 2021-08-21: 1 via ORAL
  Filled 2021-08-21: qty 1

## 2021-08-28 ENCOUNTER — Other Ambulatory Visit: Payer: Self-pay | Admitting: Gerontology

## 2021-09-10 ENCOUNTER — Other Ambulatory Visit: Payer: Self-pay | Admitting: Family Medicine

## 2021-09-10 DIAGNOSIS — D509 Iron deficiency anemia, unspecified: Secondary | ICD-10-CM | POA: Diagnosis not present

## 2021-09-10 DIAGNOSIS — E559 Vitamin D deficiency, unspecified: Secondary | ICD-10-CM | POA: Diagnosis not present

## 2021-09-10 DIAGNOSIS — N951 Menopausal and female climacteric states: Secondary | ICD-10-CM | POA: Diagnosis not present

## 2021-09-10 DIAGNOSIS — Z0000 Encounter for general adult medical examination without abnormal findings: Secondary | ICD-10-CM | POA: Diagnosis not present

## 2021-09-10 DIAGNOSIS — Z1231 Encounter for screening mammogram for malignant neoplasm of breast: Secondary | ICD-10-CM

## 2021-09-26 ENCOUNTER — Other Ambulatory Visit: Payer: Self-pay

## 2021-09-26 ENCOUNTER — Ambulatory Visit
Admission: RE | Admit: 2021-09-26 | Discharge: 2021-09-26 | Disposition: A | Discharge status: Home / Self Care | Payer: 59 | Source: Ambulatory Visit | Attending: Family Medicine | Admitting: Family Medicine

## 2021-09-26 DIAGNOSIS — Z1231 Encounter for screening mammogram for malignant neoplasm of breast: Secondary | ICD-10-CM | POA: Diagnosis not present

## 2021-10-02 ENCOUNTER — Other Ambulatory Visit: Payer: Self-pay | Admitting: Family Medicine

## 2021-10-02 DIAGNOSIS — R928 Other abnormal and inconclusive findings on diagnostic imaging of breast: Secondary | ICD-10-CM

## 2021-10-02 DIAGNOSIS — N6310 Unspecified lump in the right breast, unspecified quadrant: Secondary | ICD-10-CM

## 2021-10-07 ENCOUNTER — Other Ambulatory Visit: Payer: Self-pay

## 2021-10-07 ENCOUNTER — Ambulatory Visit
Admission: RE | Admit: 2021-10-07 | Discharge: 2021-10-07 | Disposition: A | Discharge status: Home / Self Care | Payer: 59 | Source: Ambulatory Visit | Attending: Family Medicine | Admitting: Family Medicine

## 2021-10-07 DIAGNOSIS — N6310 Unspecified lump in the right breast, unspecified quadrant: Secondary | ICD-10-CM | POA: Diagnosis not present

## 2021-10-07 DIAGNOSIS — R928 Other abnormal and inconclusive findings on diagnostic imaging of breast: Secondary | ICD-10-CM

## 2021-10-07 DIAGNOSIS — R922 Inconclusive mammogram: Secondary | ICD-10-CM | POA: Diagnosis not present

## 2022-04-17 IMAGING — MG MM DIGITAL DIAGNOSTIC UNILAT*R* W/ TOMO W/ CAD
4 series · 4 of 12 positions shown · non-contrast
Comparison: Previous exam(s).

CLINICAL DATA: Possible mass in the outer right breast a recent
screening mammogram.

EXAM:
DIGITAL DIAGNOSTIC UNILATERAL RIGHT MAMMOGRAM WITH TOMOSYNTHESIS AND
CAD; ULTRASOUND RIGHT BREAST LIMITED
TECHNIQUE: Right digital diagnostic mammography and breast tomosynthesis was
performed. The images were evaluated with computer-aided detection.;
Targeted ultrasound examination of the right breast was performed

[R CC synth-2D]
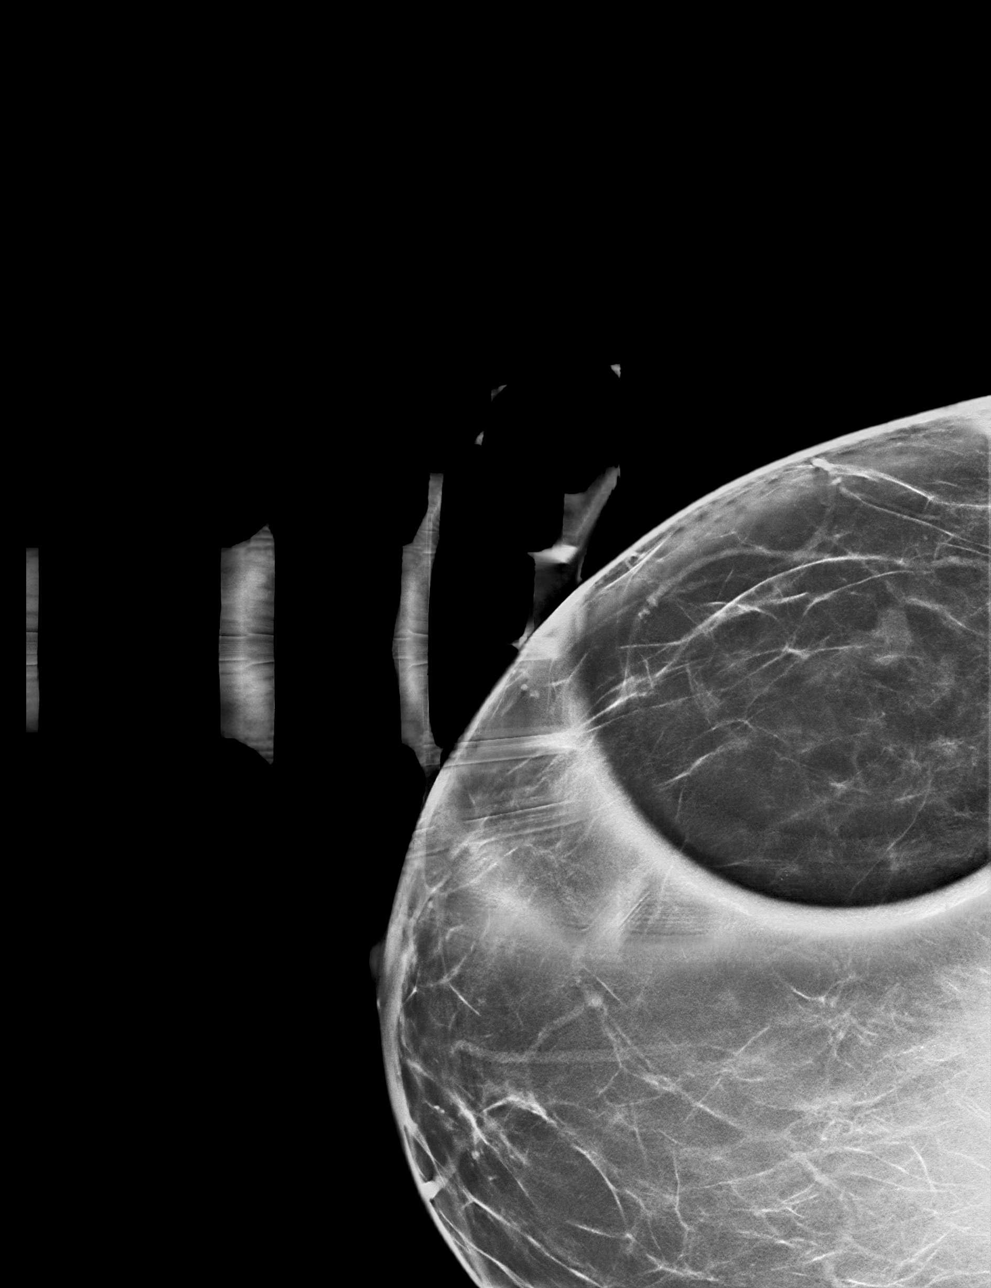

[R MLO synth-2D]
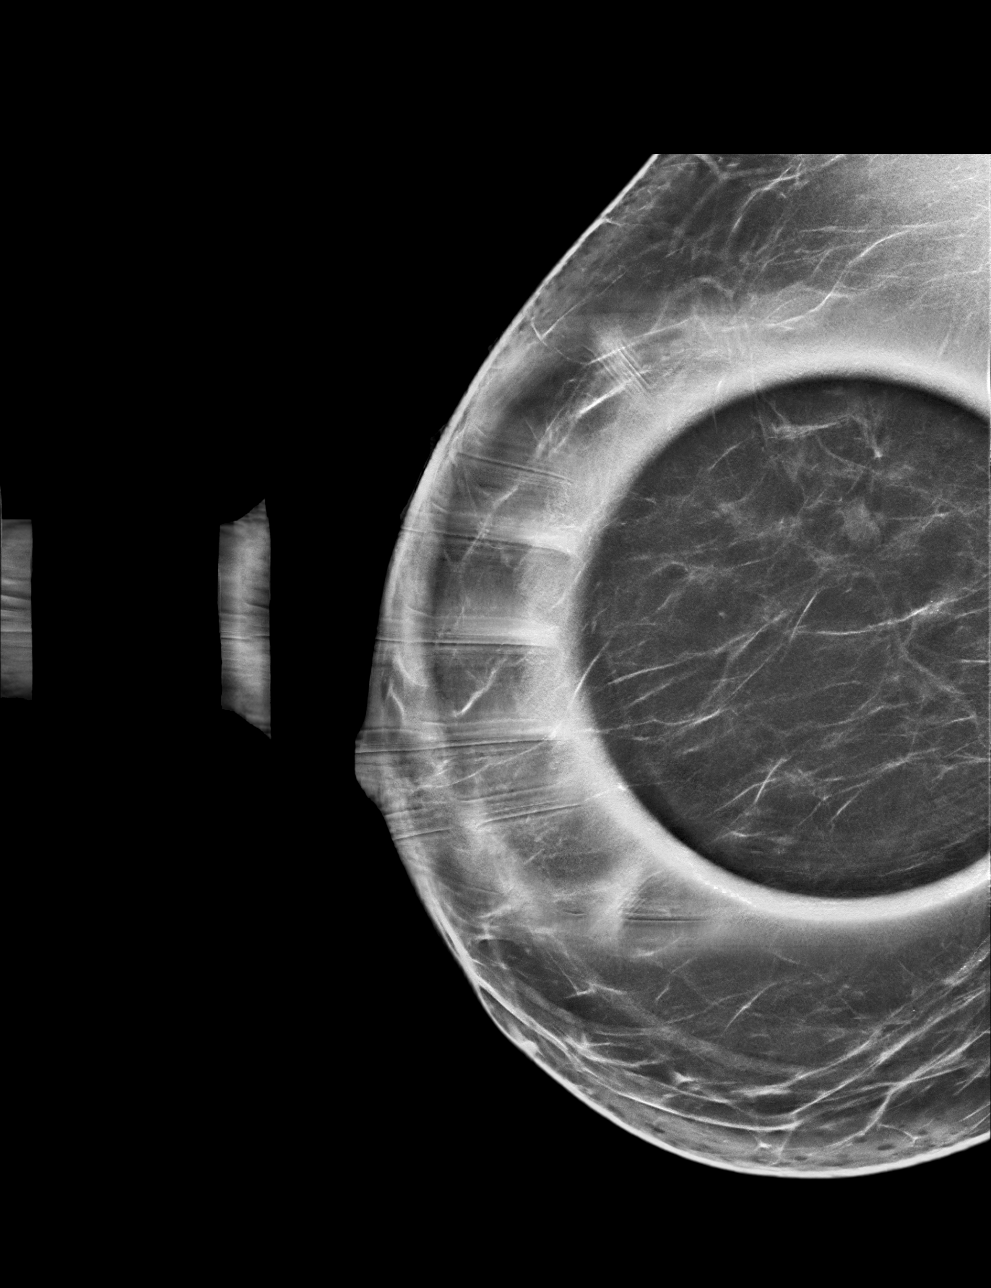

[R CC tomo · tomo slice 37/73.0]
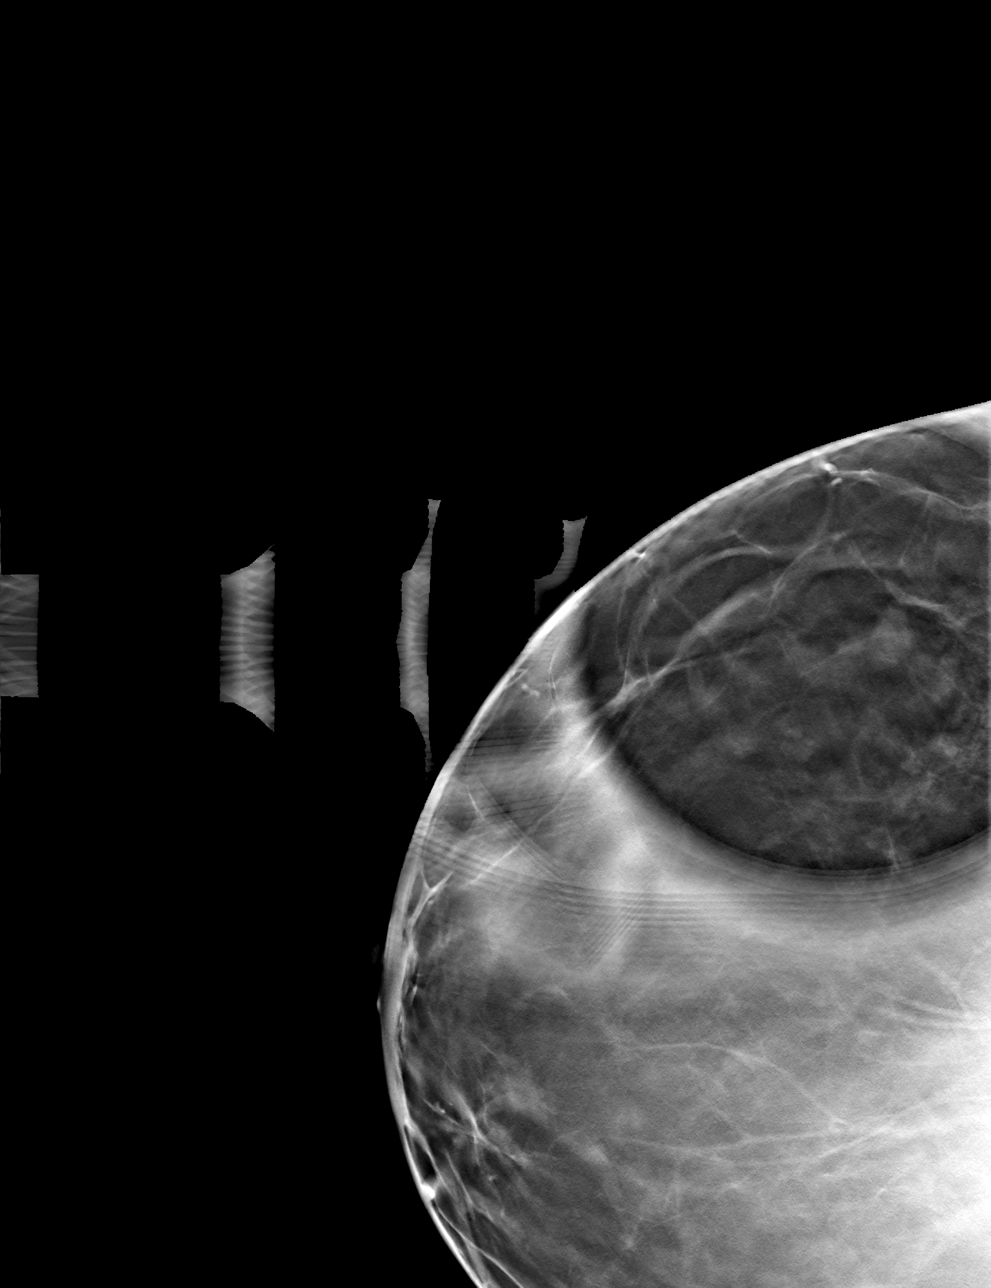

[R MLO tomo · tomo slice 38/75.0]
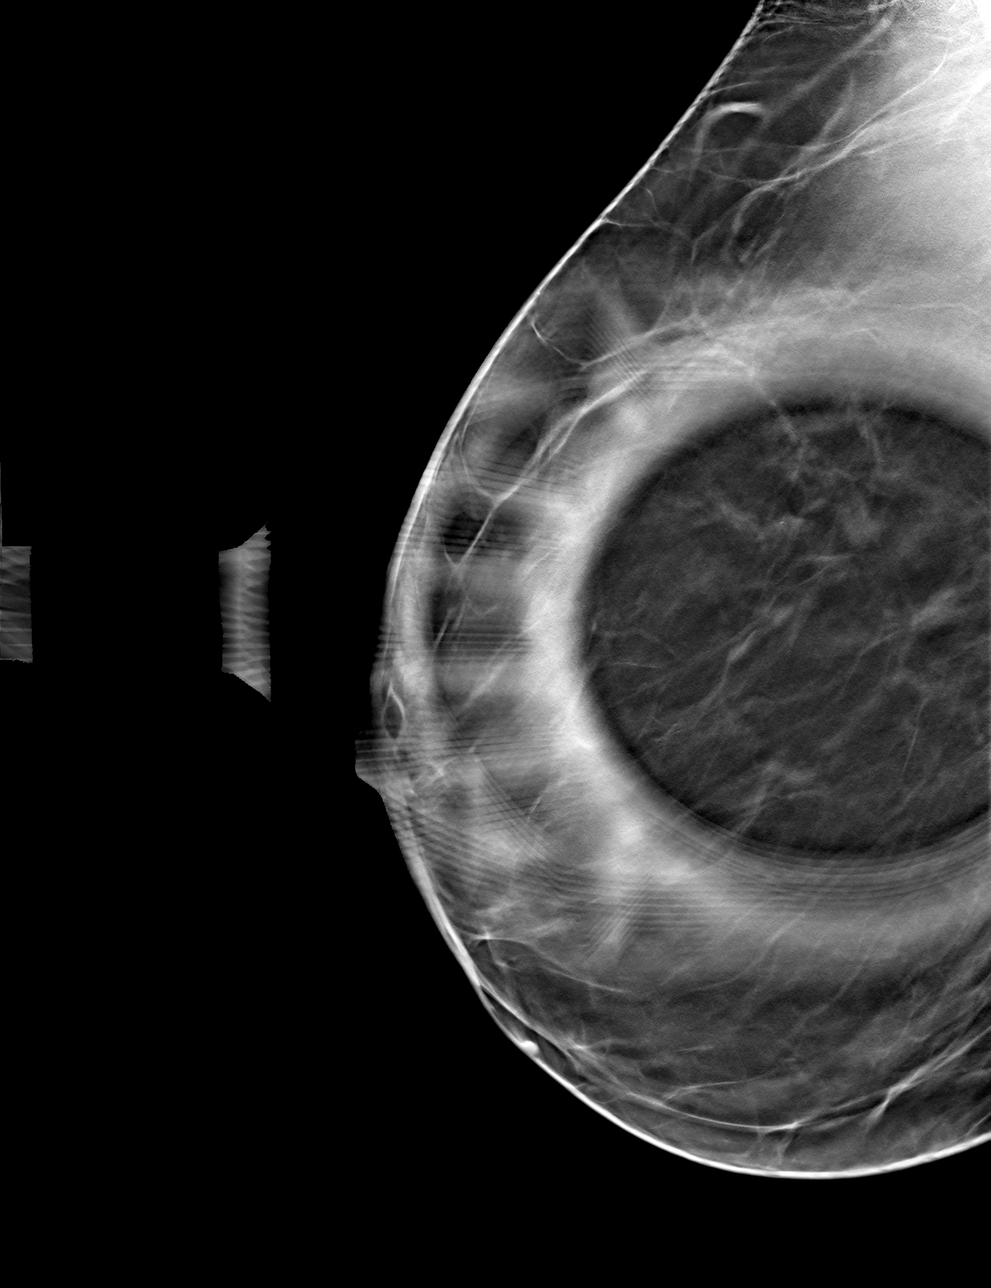

[4 of 12 positions shown; findings below may reference images not displayed]

ACR Breast Density Category b: There are scattered areas of
fibroglandular density.
FINDINGS: 3D tomographic and 2D generated spot compression images of the right
breast confirm a small, oval mass with small lobulations in the
posterior outer right breast.

Targeted ultrasound is performed, showing a 6 x 5 x 5 mm cyst
containing some low-level internal echoes and a thin partial
internal septation in the 8:30 o'clock position of the right breast,
9 cm from the nipple. No internal blood flow was seen with power
Doppler. This corresponds to the mammographic mass.
IMPRESSION: Benign right breast cyst.  No evidence of malignancy.

RECOMMENDATION:
Bilateral screening mammogram in 1 year when due.

I have discussed the findings and recommendations with the patient.
If applicable, a reminder letter will be sent to the patient
regarding the next appointment.

BI-RADS CATEGORY  2: Benign.

## 2022-04-17 IMAGING — US US BREAST*R* LIMITED INC AXILLA
1 series · 10 of 10 positions shown · non-contrast
Comparison: Previous exam(s).

CLINICAL DATA: Possible mass in the outer right breast a recent
screening mammogram.

EXAM:
DIGITAL DIAGNOSTIC UNILATERAL RIGHT MAMMOGRAM WITH TOMOSYNTHESIS AND
CAD; ULTRASOUND RIGHT BREAST LIMITED
TECHNIQUE: Right digital diagnostic mammography and breast tomosynthesis was
performed. The images were evaluated with computer-aided detection.;
Targeted ultrasound examination of the right breast was performed

[Series 1: us breast*right* limited inc axilla · 0.06mm/px · 10 of 10 slices shown]
[im 1/10]
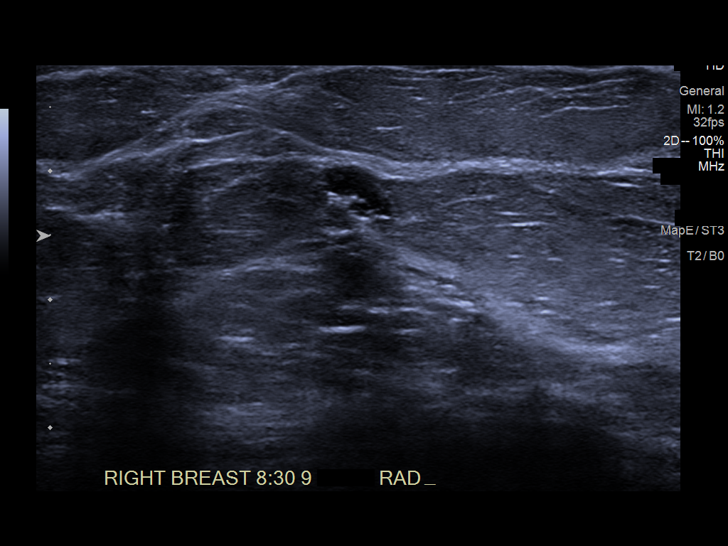
[im 2/10]
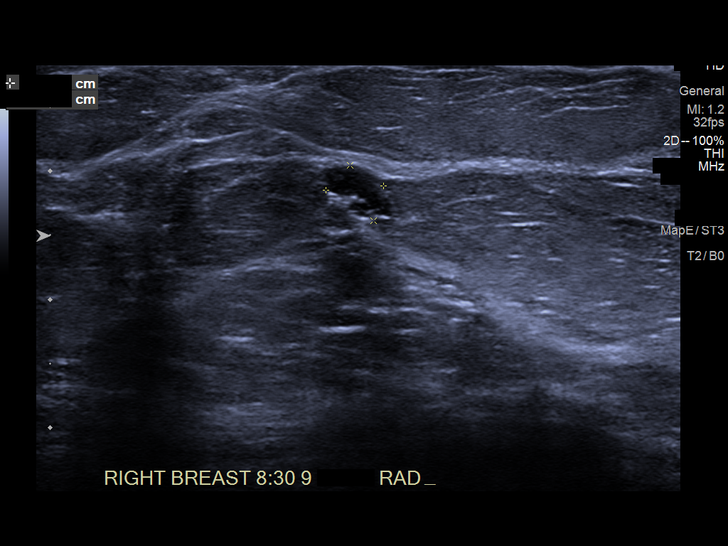
[im 3/10]
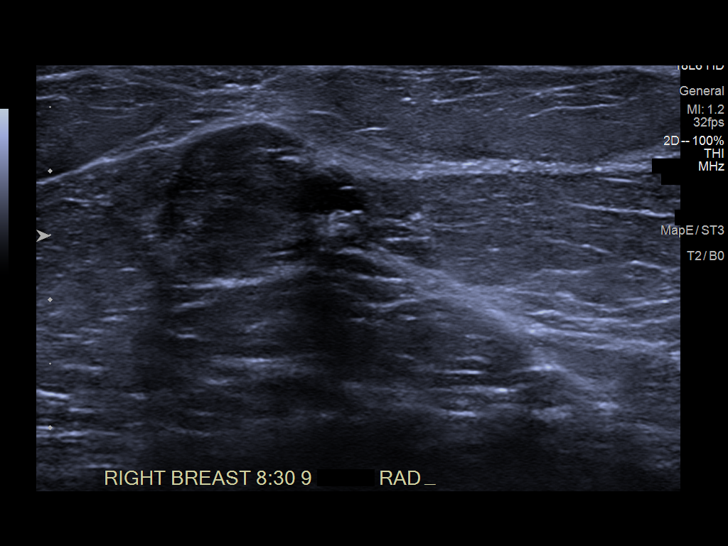
[im 4/10]
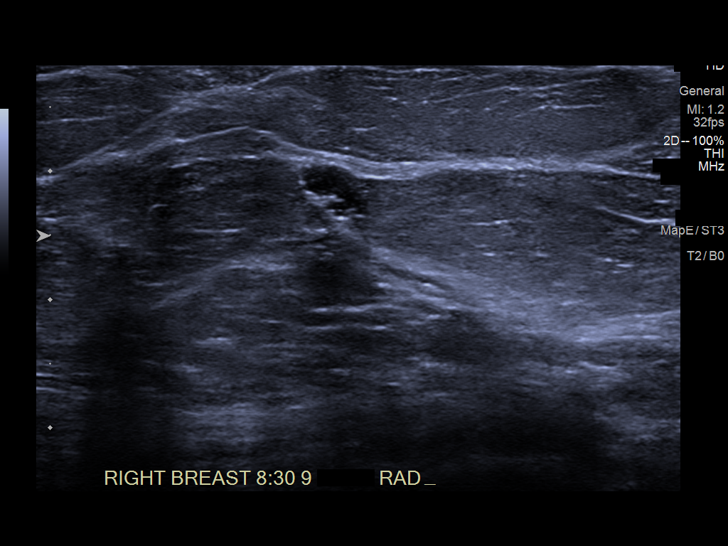
[im 5/10]
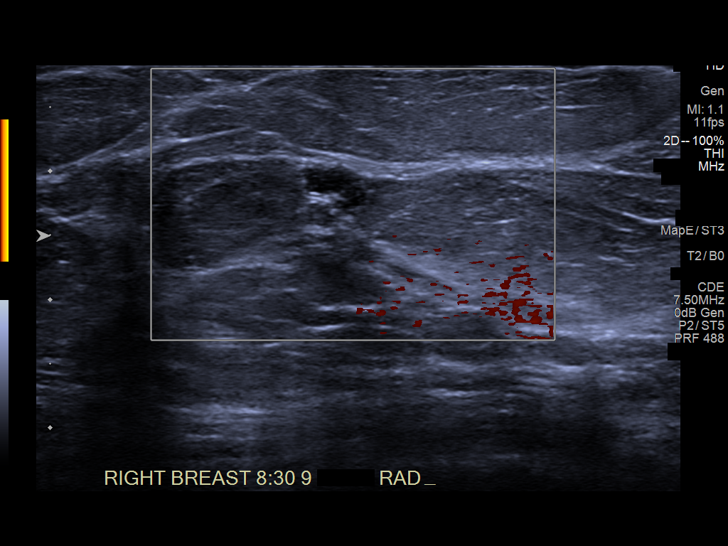
[im 6/10]
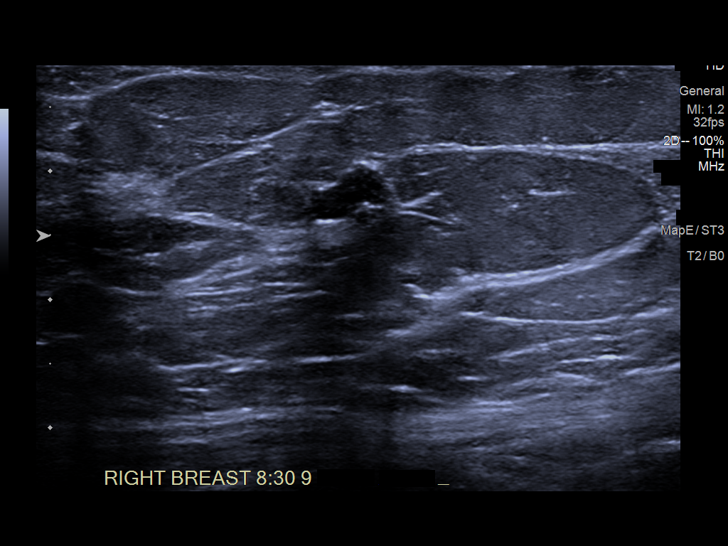
[im 7/10]
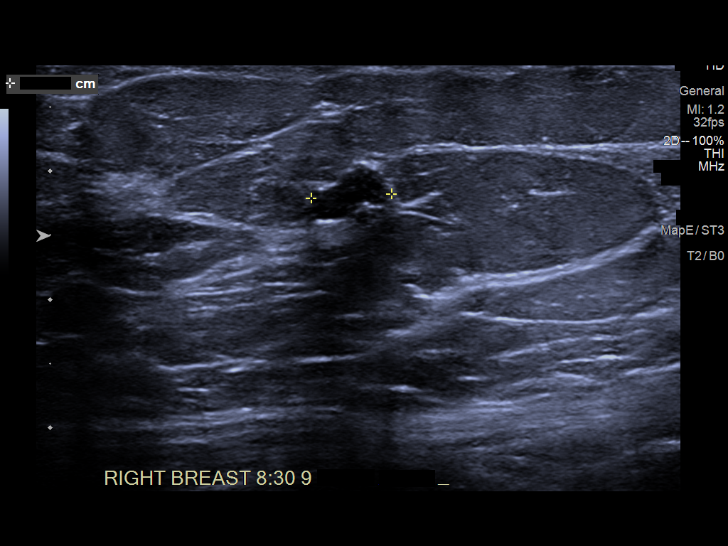
[im 8/10]
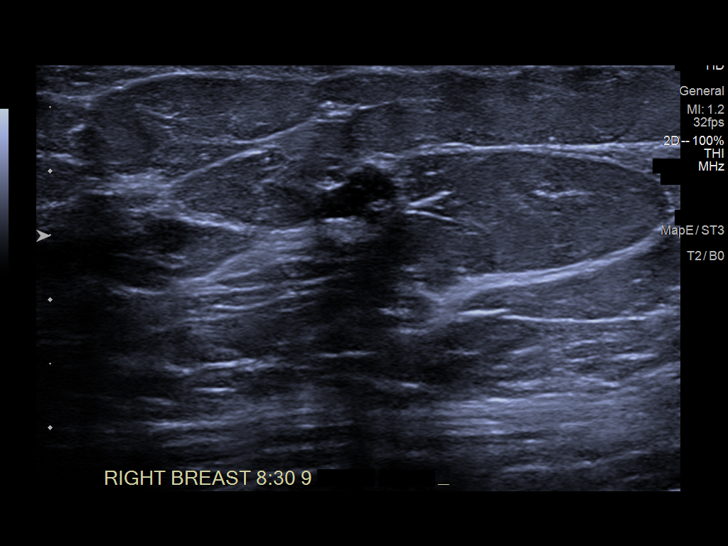
[im 9/10]
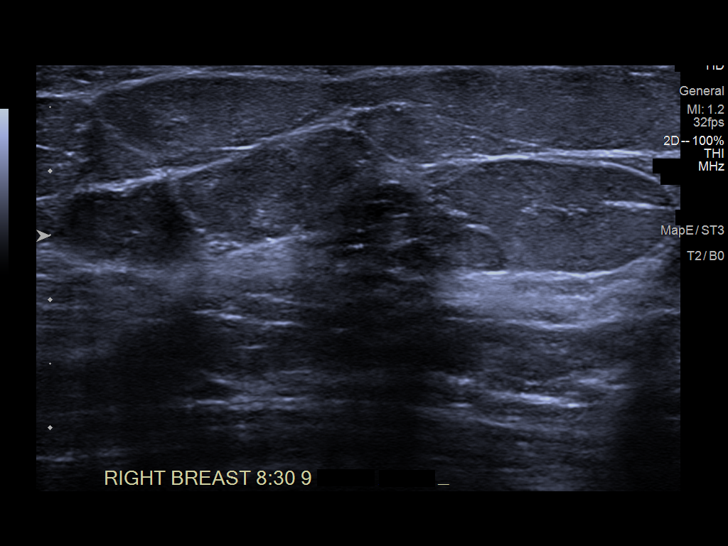
[im 10/10]
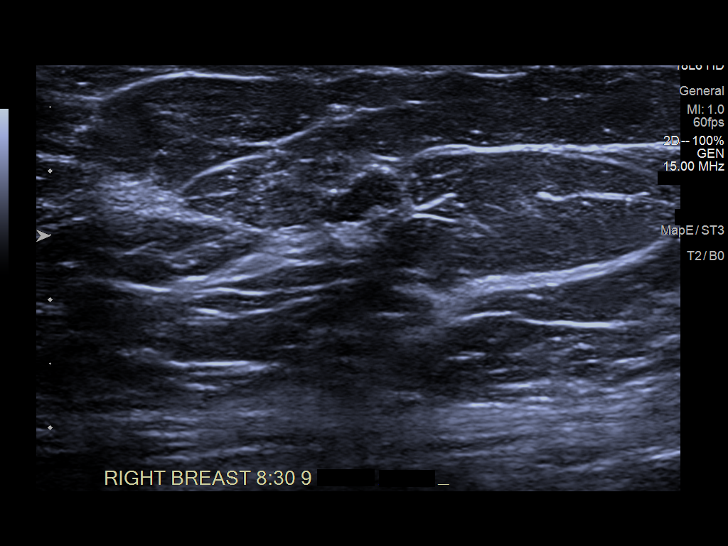

[10 of 10 positions shown; findings below may reference images not displayed]

ACR Breast Density Category b: There are scattered areas of
fibroglandular density.
FINDINGS: 3D tomographic and 2D generated spot compression images of the right
breast confirm a small, oval mass with small lobulations in the
posterior outer right breast.

Targeted ultrasound is performed, showing a 6 x 5 x 5 mm cyst
containing some low-level internal echoes and a thin partial
internal septation in the 8:30 o'clock position of the right breast,
9 cm from the nipple. No internal blood flow was seen with power
Doppler. This corresponds to the mammographic mass.
IMPRESSION: Benign right breast cyst.  No evidence of malignancy.

RECOMMENDATION:
Bilateral screening mammogram in 1 year when due.

I have discussed the findings and recommendations with the patient.
If applicable, a reminder letter will be sent to the patient
regarding the next appointment.

BI-RADS CATEGORY  2: Benign.

## 2022-11-13 ENCOUNTER — Other Ambulatory Visit: Payer: Self-pay | Admitting: Family Medicine

## 2022-11-13 DIAGNOSIS — Z124 Encounter for screening for malignant neoplasm of cervix: Secondary | ICD-10-CM | POA: Diagnosis not present

## 2022-11-13 DIAGNOSIS — N951 Menopausal and female climacteric states: Secondary | ICD-10-CM | POA: Diagnosis not present

## 2022-11-13 DIAGNOSIS — D509 Iron deficiency anemia, unspecified: Secondary | ICD-10-CM | POA: Diagnosis not present

## 2022-11-13 DIAGNOSIS — Z1211 Encounter for screening for malignant neoplasm of colon: Secondary | ICD-10-CM | POA: Diagnosis not present

## 2022-11-13 DIAGNOSIS — M79672 Pain in left foot: Secondary | ICD-10-CM | POA: Diagnosis not present

## 2022-11-13 DIAGNOSIS — E559 Vitamin D deficiency, unspecified: Secondary | ICD-10-CM | POA: Diagnosis not present

## 2022-11-13 DIAGNOSIS — Z0000 Encounter for general adult medical examination without abnormal findings: Secondary | ICD-10-CM | POA: Diagnosis not present

## 2022-11-13 DIAGNOSIS — Z1231 Encounter for screening mammogram for malignant neoplasm of breast: Secondary | ICD-10-CM

## 2022-12-04 ENCOUNTER — Ambulatory Visit
Admission: RE | Admit: 2022-12-04 | Discharge: 2022-12-04 | Disposition: A | Discharge status: Home / Self Care | Payer: 59 | Source: Ambulatory Visit | Attending: Family Medicine | Admitting: Family Medicine

## 2022-12-04 DIAGNOSIS — Z1231 Encounter for screening mammogram for malignant neoplasm of breast: Secondary | ICD-10-CM | POA: Insufficient documentation

## 2023-11-16 ENCOUNTER — Other Ambulatory Visit: Payer: Self-pay | Admitting: Family Medicine

## 2023-11-16 DIAGNOSIS — Z1231 Encounter for screening mammogram for malignant neoplasm of breast: Secondary | ICD-10-CM | POA: Diagnosis not present

## 2023-11-16 DIAGNOSIS — Z1211 Encounter for screening for malignant neoplasm of colon: Secondary | ICD-10-CM | POA: Diagnosis not present

## 2023-11-16 DIAGNOSIS — N951 Menopausal and female climacteric states: Secondary | ICD-10-CM | POA: Diagnosis not present

## 2023-11-16 DIAGNOSIS — Z0000 Encounter for general adult medical examination without abnormal findings: Secondary | ICD-10-CM | POA: Diagnosis not present

## 2023-11-16 DIAGNOSIS — D509 Iron deficiency anemia, unspecified: Secondary | ICD-10-CM | POA: Diagnosis not present

## 2023-11-16 DIAGNOSIS — R519 Headache, unspecified: Secondary | ICD-10-CM | POA: Diagnosis not present

## 2023-11-16 DIAGNOSIS — E559 Vitamin D deficiency, unspecified: Secondary | ICD-10-CM | POA: Diagnosis not present

## 2023-12-09 ENCOUNTER — Inpatient Hospital Stay: Admission: RE | Admit: 2023-12-09 | Payer: 59 | Source: Ambulatory Visit

## 2023-12-17 ENCOUNTER — Ambulatory Visit
Admission: RE | Admit: 2023-12-17 | Discharge: 2023-12-17 | Disposition: A | Discharge status: Home / Self Care | Payer: BLUE CROSS/BLUE SHIELD | Source: Ambulatory Visit | Attending: Family Medicine | Admitting: Family Medicine

## 2023-12-17 DIAGNOSIS — Z1231 Encounter for screening mammogram for malignant neoplasm of breast: Secondary | ICD-10-CM | POA: Insufficient documentation

## 2024-02-29 DIAGNOSIS — Z1211 Encounter for screening for malignant neoplasm of colon: Secondary | ICD-10-CM | POA: Diagnosis not present

## 2024-03-01 DIAGNOSIS — M654 Radial styloid tenosynovitis [de Quervain]: Secondary | ICD-10-CM | POA: Diagnosis not present

## 2024-04-16 ENCOUNTER — Ambulatory Visit (INDEPENDENT_AMBULATORY_CARE_PROVIDER_SITE_OTHER)

## 2024-04-16 ENCOUNTER — Ambulatory Visit
Admission: EM | Admit: 2024-04-16 | Discharge: 2024-04-16 | Disposition: A | Discharge status: Home / Self Care | Attending: Family Medicine | Admitting: Family Medicine

## 2024-04-16 DIAGNOSIS — M25532 Pain in left wrist: Secondary | ICD-10-CM

## 2024-04-16 DIAGNOSIS — M65932 Unspecified synovitis and tenosynovitis, left forearm: Secondary | ICD-10-CM | POA: Diagnosis not present

## 2024-04-16 DIAGNOSIS — M79645 Pain in left finger(s): Secondary | ICD-10-CM | POA: Diagnosis not present

## 2024-04-16 DIAGNOSIS — M65942 Unspecified synovitis and tenosynovitis, left hand: Secondary | ICD-10-CM | POA: Diagnosis not present

## 2024-04-16 MED ORDER — NAPROXEN 500 MG PO TABS: 500.0000 mg | ORAL_TABLET | Freq: Two times a day (BID) | ORAL | 0 refills | Status: AC

## 2024-04-16 NOTE — ED Provider Notes (Addendum)
 [facility]    CSN: [id number] Arrival date & time: 04/16/24  0827      History   Chief Complaint Chief Complaint  Patient presents with   Wrist Pain    HPI  HPI Laura Cummings is a 53 y.o. female.   Laura Cummings presents for left wrist pain for the past 2-3 months. She was seen by her PCP who told her she had tendonitis and prescribed her prednisone. Says she was told to by a wrist brace which she bought but it was flimsy.  These did not help. Bending her wrist a certain way increased the pain. Has been taking nothing for pain. Denies falls or trauma.   Recently, she had been doing a lot of yard work. When this started she had been doing a lot of raking.  She works in [employer] and performs a lot of typing.   She is right handed.     History reviewed. No pertinent past medical history.  There are no active problems to display for this patient.   History reviewed. No pertinent surgical history.  OB History   No obstetric history on file.      Home Medications    Prior to Admission medications   Medication Sig Start Date End Date Taking? Authorizing Provider  naproxen  (NAPROSYN ) 500 MG tablet Take 1 tablet (500 mg total) by mouth 2 (two) times daily with a meal. 04/16/24  Yes Billi Bright, DO  fexofenadine -pseudoephedrine (ALLEGRA-D) 60-120 MG 12 hr tablet Take 1 tablet by mouth 2 (two) times daily. 11/13/16   Claudene Tanda POUR, PA-C    Family History Family History  Problem Relation Age of Onset   Breast cancer Neg Hx     Social History Social History   Tobacco Use   Smoking status: Never    Passive exposure: Never   Smokeless tobacco: Never  Vaping Use   Vaping status: Never Used  Substance Use Topics   Alcohol use: No     Allergies   Patient has no known allergies.   Review of Systems Review of Systems: :negative unless otherwise stated in HPI.      Physical Exam Triage Vital Signs ED Triage Vitals  Encounter Vitals  Group     BP 04/16/24 0841 110/75     Girls Systolic BP Percentile --      Girls Diastolic BP Percentile --      Boys Systolic BP Percentile --      Boys Diastolic BP Percentile --      Pulse Rate 04/16/24 0841 65     Resp 04/16/24 0841 16     Temp 04/16/24 0841 98.5 F (36.9 C)     Temp Source 04/16/24 0841 Oral     SpO2 04/16/24 0841 98 %     Weight --      Height --      Head Circumference --      Peak Flow --      Pain Score 04/16/24 0840 0     Pain Loc --      Pain Education --      Exclude from Growth Chart --    No data found.  Updated Vital Signs BP 110/75 (BP Location: Right Arm)   Pulse 65   Temp 98.5 F (36.9 C) (Oral)   Resp 16   SpO2 98%   Visual Acuity Right Eye Distance:   Left Eye Distance:   Bilateral Distance:    Right Eye Near:  Left Eye Near:    Bilateral Near:     Physical Exam GEN: well appearing female in no acute distress  CVS: well perfused  RESP: speaking in full sentences without pause, no respiratory distress  MSK:   Wrist, Left : Inspection yielded no erythema, ecchymosis, bony deformity, or swelling. ROM full with good flexion and extension and ulnar/radial deviation that is symmetrical with opposite wrist. Palpation is CMC joint causes tenderness. No pain over 2nd-5th metacarpals, scaphoid; TTP of tendons without swelling. Strength 5/5 in all directions without pain. Positive Finkelstein.    UC Treatments / Results  Labs (all labs ordered are listed, but only abnormal results are displayed) Labs Reviewed - No data to display  EKG   Radiology DG Finger Thumb Left Result Date: 04/16/2024 CLINICAL DATA:  Pain with movement. EXAM: LEFT THUMB 2+V COMPARISON:  None Available. FINDINGS: No signs of acute fracture or subluxation. No significant arthropathy identified. Soft tissues are unremarkable. IMPRESSION: Negative. Electronically Signed   By: Waddell Calk M.D.   On: 04/16/2024 09:33   DG Wrist Complete Left Result Date:  04/16/2024 CLINICAL DATA:  Pain with movement for 2-3 months. EXAM: LEFT WRIST - COMPLETE 3+ VIEW COMPARISON:  None Available. FINDINGS: No signs of acute fracture or dislocation. No significant arthropathy. Soft tissues are unremarkable. IMPRESSION: Negative. Electronically Signed   By: Waddell Calk M.D.   On: 04/16/2024 09:32     Procedures Procedures (including critical care time)  Medications Ordered in UC Medications - No data to display  Initial Impression / Assessment and Plan / UC Course  I have reviewed the triage vital signs and the nursing notes.  Pertinent labs & imaging results that were available during my care of the patient were reviewed by me and considered in my medical decision making (see chart for details).      Pt is a 53 y.o.  female with 3 months of left wrist pain that has gotten worse over the past couple of weeks after doing yard work.   Declinded pain control here.   On exam, pt has tenderness at [facility] joint and carpals concerning for bony injury.   Obtained left wrist and thumb plain films.  Personally interpreted by me were unremarkable for fracture or dislocation. Radiologist report reviewed and additionally notes  no soft tissue swelling.  Given left wrist brace with thumb spica. Advised to wear 24/7 and only take off for bathing and water based activities.   Patient to gradually return to normal activities, as tolerated and continue ordinary activities within the limits permitted by pain. Prescribed Naproxen  sodium  a for pain relief.  Tylenol  PRN. Advised patient to avoid OTC NSAIDs while taking prescription NSAID.   Patient to follow up with orthopedic provider, if symptoms do not improve with conservative treatment.  Return and ED precautions given. Understanding voiced. Discussed MDM, treatment plan and plan for follow-up with patient who agrees with plan.   Radiologist impression reviewed. No change in plan warranted.   Final Clinical Impressions(s) /  UC Diagnoses   Final diagnoses:  Left wrist pain  Tenosynovitis of left wrist     Discharge Instructions      On my review of your xray images, you did not have any fractures or dislocated bones. The radiologist has not yet read your xray. If it is significantly abnormal or urgent, someone will contact you.  You should see your results in MyChart.   If medication was prescribed, stop by the pharmacy to  pick up your prescriptions.  For your  pain, Take 1500 mg Tylenol  twice a day with take Naprosyn  twice a day,  as needed for pain.Wear your wrist brace 27/7 and but may remove for water related activities.  Rest and elevate the affected painful area.  Apply warm compresses intermittently, as needed.  As pain recedes, begin normal activities slowly as tolerated.  Follow up with  an orthopedic provider, if symptoms persist.  Watch for worsening symptoms such as an increasing weakness or loss of sensation, increasing pain and/or the loss of bladder or bowel function. Should any of these occur, go to the emergency department immediately.        ED Prescriptions     Medication Sig Dispense Auth. Provider   naproxen  (NAPROSYN ) 500 MG tablet Take 1 tablet (500 mg total) by mouth 2 (two) times daily with a meal. 30 tablet Brityn Mastrogiovanni, DO      PDMP not reviewed this encounter.    Azariel Banik, DO 04/16/24 1013

## 2024-04-16 NOTE — Discharge Instructions (Addendum)
 On my review of your xray images, you did not have any fractures or dislocated bones. The radiologist has not yet read your xray. If it is significantly abnormal or urgent, someone will contact you.  You should see your results in MyChart.   If medication was prescribed, stop by the pharmacy to pick up your prescriptions.  For your  pain, Take 1500 mg Tylenol  twice a day with take Naprosyn  twice a day,  as needed for pain.Wear your wrist brace 27/7 and but may remove for water related activities.  Rest and elevate the affected painful area.  Apply warm compresses intermittently, as needed.  As pain recedes, begin normal activities slowly as tolerated.  Follow up with  an orthopedic provider, if symptoms persist.  Watch for worsening symptoms such as an increasing weakness or loss of sensation, increasing pain and/or the loss of bladder or bowel function. Should any of these occur, go to the emergency department immediately.

## 2024-04-16 NOTE — ED Triage Notes (Signed)
 Wrist pain x 3 months. Hurts when being moved a certain way. Left wrist

## 2024-05-13 ENCOUNTER — Encounter: Payer: Self-pay | Admitting: *Deleted

## 2024-05-27 ENCOUNTER — Ambulatory Visit: Admission: RE | Admit: 2024-05-27 | Payer: Self-pay | Source: Home / Self Care

## 2024-05-27 SURGERY — COLONOSCOPY
Anesthesia: General

## 2024-11-25 ENCOUNTER — Other Ambulatory Visit: Payer: Self-pay | Admitting: Family Medicine

## 2024-11-25 DIAGNOSIS — Z1231 Encounter for screening mammogram for malignant neoplasm of breast: Secondary | ICD-10-CM

## 2024-12-29 ENCOUNTER — Ambulatory Visit
